# Patient Record
Sex: Female | Born: 1995 | Race: White
Health system: Southern US, Community
[De-identification: ages and names within clinical notes are randomized; demographics above are authoritative.]

## PROBLEM LIST (undated history)

## (undated) DIAGNOSIS — E0580 Other thyrotoxicosis without thyrotoxic crisis or storm: Secondary | ICD-10-CM

## (undated) DIAGNOSIS — M419 Scoliosis, unspecified: Secondary | ICD-10-CM

## (undated) DIAGNOSIS — E063 Autoimmune thyroiditis: Secondary | ICD-10-CM

## (undated) DIAGNOSIS — E049 Nontoxic goiter, unspecified: Secondary | ICD-10-CM

## (undated) DIAGNOSIS — N915 Oligomenorrhea, unspecified: Secondary | ICD-10-CM

## (undated) HISTORY — DX: Oligomenorrhea, unspecified: N91.5

## (undated) HISTORY — DX: Autoimmune thyroiditis: E06.3

## (undated) HISTORY — DX: Nontoxic goiter, unspecified: E04.9

## (undated) HISTORY — DX: Scoliosis, unspecified: M41.9

## (undated) HISTORY — DX: Autoimmune thyroiditis: E05.80

---
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
## 2014-02-26 ENCOUNTER — Telehealth: Payer: Self-pay | Admitting: Pediatrics

## 2014-02-26 NOTE — Telephone Encounter (Signed)
Mom and Kara SalterHaley are deciding if they are going to find an adult doctor or make an appointment with us. Mom will call us back when they decide.

## 2014-02-26 NOTE — Telephone Encounter (Signed)
Mom had questions about transitioning into family practice versus staying at pediatrics Assured her that we will see Kara Guerra while she is in school Concerns include post blood work that shows the potential of developing hypothyroidism and had estrogen levels checked approximately 1 year ago Kara Guerra has a history of anorexia, sees a nutritionist at school and a Warden/rangerpsychologist at home.  Kara Guerra has not had a period in 2 years and has gotten her weight to approximately 125lbs  Transferred call to schedulers.

## 2014-07-21 ENCOUNTER — Other Ambulatory Visit (HOSPITAL_COMMUNITY): Payer: Self-pay | Admitting: Nurse Practitioner

## 2014-07-21 DIAGNOSIS — N912 Amenorrhea, unspecified: Secondary | ICD-10-CM

## 2014-07-21 DIAGNOSIS — F5000 Anorexia nervosa, unspecified: Secondary | ICD-10-CM

## 2014-07-27 ENCOUNTER — Ambulatory Visit (HOSPITAL_COMMUNITY)
Admission: RE | Admit: 2014-07-27 | Discharge: 2014-07-27 | Disposition: A | Discharge status: Home / Self Care | Payer: 59 | Source: Ambulatory Visit | Attending: Pediatrics | Admitting: Pediatrics

## 2014-07-27 DIAGNOSIS — N912 Amenorrhea, unspecified: Secondary | ICD-10-CM

## 2014-07-27 DIAGNOSIS — F5000 Anorexia nervosa, unspecified: Secondary | ICD-10-CM | POA: Diagnosis not present

## 2014-07-27 DIAGNOSIS — Z13820 Encounter for screening for osteoporosis: Secondary | ICD-10-CM | POA: Diagnosis not present

## 2014-10-15 DIAGNOSIS — R630 Anorexia: Secondary | ICD-10-CM

## 2014-10-15 HISTORY — DX: Anorexia: R63.0

## 2020-11-18 NOTE — Progress Notes (Signed)
[facility] [place]                       Date of Exam: 11/23/2020 10:56 AM        Patient ID: Katelyn Owens is a 25 y.o. female.  Attending Physician: Freddie Apley, MD        Chief Complaint:    Chief Complaint   Patient presents with    Annual Exam     non fasting     Gynecologic Exam           Assessment / Plan:    1. Well female exam with routine gynecological exam    - CBC and differential  - Comprehensive metabolic panel  - TSH, Abn Reflex to Free T4, Serum  - Pap Smear, Thin Prep Method w/ HR HPV    2. Encounter for contraceptive management, unspecified type  Tolerating OCP. Will be running out soon (2 weeks)  Patient is due for lab/s - once back and result/s unremarkable, will refill patient's medication/s good for: 6 months.              Other Notes on Plan:    NONE              Follow-up:    No follow-ups on file.           HPI:    Visit Type: Preventative Visit    Reported Health: good health  Interval Events: none    Work Status: Nurse, learning disability residency  Diet: balanced diet   Exercise: weight lifting daily     Reproductive Health: sexually active and using contraception  STD concerns: denies knowledge of risky exposure  Patient is:   OB History   No obstetric history on file.       Health Screenings:  >Pap smear last done: 4 yrs ago, at 25 y/o. Normal per patient.             >Hepatitis C Screening: No components found for: HEPCAB     General Health Risks: no family history of colon cancer and no family history of breast cancer    There is no immunization history on file for this patient.    There are no preventive care reminders to display for this patient.    Other Concerns:     She's needing refill of her OCP  Tolerating this well. No history of migraines, liver issues. Non-smoker  Last time she's had labs done - she thinks may have been 3 yrs ago                Problem List:    There is no problem list on file for this patient.            Current Meds:     Outpatient Medications Marked as Taking for the 11/23/20 encounter (Office Visit) with Freddie Apley, MD   Medication Sig Dispense Refill    ibuprofen (ADVIL) 200 MG tablet ibuprofen 200 mg tablet   Take 1 tablet as needed by oral route.      Lysine 500 MG Cap Take by mouth      Multiple Vitamins-Minerals (Multivitamin Women) Tab Take by mouth      Nikki 3-0.02 MG per tablet Take 1 tablet by mouth daily            Allergies:    Not on File  Past Surgical History:    No past surgical history on file.        Family History:    Family History   Problem Relation Age of Onset    Thyroid disease Mother         hypothyroid    No known problems Father            Social History:    Social History     Tobacco Use    Smoking status: Never Smoker    Smokeless tobacco: Never Used   Haematologist Use: Never used   Substance Use Topics    Alcohol use: Yes     Comment: once a month    Drug use: Never           The following sections were reviewed this encounter by the provider:   Tobacco   Allergies   Meds   Problems   Med Hx   Surg Hx   Fam Hx              ROS:    Review of Systems   Constitutional: Negative for fever, negative for chills.   Respiratory: Negative for cough, negative for shortness of breath.  Gastrointestinal: Negative for abdominal distention.             Vital Signs:    BP 118/73 (BP Site: Left arm, Patient Position: Sitting, Cuff Size: Medium)    Pulse 60    Temp 97.9 F (36.6 C) (Temporal)    Resp 14    Ht 1.676 m (5\' 6" )    Wt 68 kg (150 lb)    LMP 10/31/2020 (Exact Date)    BMI 24.21 kg/m          Physical Exam:    Physical Exam   Constitutional: Appears well-developed and well-nourished.      HENT:   Head: Atraumatic.   Eyes: Conjunctivae and EOMs are normal.   Ears: Clear TM bilaterally  Mouth/Throat: tonsils not enlarged, no exudates     Neck: Neck supple.   Cardiovascular: Normal rate, regular rhythm, normal heart sounds, no murmur heard.  Pulmo/Chest: Effort normal, no  respiratory distress, breath sounds normal, no wheezes.   Abdominal: No distension, soft, no tenderness, no hepatosplenomegaly  Skin: Skin is warm.     Psychiatric: Has normal mood and affect. Behavior is normal. Thought content normal.   Neurologic/Musculoskeletal: Alert. Gait normal.                  No edema.     Breast (offered but patient declined having a chaperone for this exam): no skin changes, no discrete masses palpated, no axillary LN palpated, no nipple discharge    PELVIC EXAM  (offered but patient declined having a chaperone for this exam):   Vulva/Perineum: No lesions, normal hair distribution, normal anus   Urethral Meatus: Normal location and size, no lesions   Urethra: No masses, tenderness or scarring   Bladder: No masses, no tenderness   Vagina: Normal appearance, no lesions, no discharge   Cervix: No motion tenderness, no mass   Uterus: Normal size/contour/position, mobile, no tenderness   Adnexae: No masses, tenderness or nodularity              Freddie Apley, MD

## 2020-11-23 ENCOUNTER — Ambulatory Visit (INDEPENDENT_AMBULATORY_CARE_PROVIDER_SITE_OTHER): Payer: No Typology Code available for payment source | Admitting: Family Medicine

## 2020-11-23 ENCOUNTER — Encounter (INDEPENDENT_AMBULATORY_CARE_PROVIDER_SITE_OTHER): Payer: Self-pay | Admitting: Family Medicine

## 2020-11-23 VITALS — BP 118/73 | HR 60 | Temp 97.9°F | Resp 14 | Ht 66.0 in | Wt 150.0 lb

## 2020-11-23 DIAGNOSIS — E871 Hypo-osmolality and hyponatremia: Secondary | ICD-10-CM

## 2020-11-23 DIAGNOSIS — Z01419 Encounter for gynecological examination (general) (routine) without abnormal findings: Secondary | ICD-10-CM

## 2020-11-23 DIAGNOSIS — D696 Thrombocytopenia, unspecified: Secondary | ICD-10-CM

## 2020-11-23 DIAGNOSIS — Z309 Encounter for contraceptive management, unspecified: Secondary | ICD-10-CM

## 2020-11-23 LAB — HEMOLYSIS INDEX: Hemolysis Index: 3 (ref 0–24)

## 2020-11-23 LAB — CBC AND DIFFERENTIAL
Absolute NRBC: 0 10*3/uL (ref 0.00–0.00)
Basophils Absolute Automated: 0.06 10*3/uL (ref 0.00–0.08)
Basophils Automated: 1 %
Eosinophils Absolute Automated: 0.08 10*3/uL (ref 0.00–0.44)
Eosinophils Automated: 1.3 %
Hematocrit: 41 % (ref 34.7–43.7)
Hgb: 13.5 g/dL (ref 11.4–14.8)
Immature Granulocytes Absolute: 0.01 10*3/uL (ref 0.00–0.07)
Immature Granulocytes: 0.2 %
Lymphocytes Absolute Automated: 2.08 10*3/uL (ref 0.42–3.22)
Lymphocytes Automated: 34.6 %
MCH: 29.8 pg (ref 25.1–33.5)
MCHC: 32.9 g/dL (ref 31.5–35.8)
MCV: 90.5 fL (ref 78.0–96.0)
MPV: 12 fL (ref 8.9–12.5)
Monocytes Absolute Automated: 0.64 10*3/uL (ref 0.21–0.85)
Monocytes: 10.6 %
Neutrophils Absolute: 3.15 10*3/uL (ref 1.10–6.33)
Neutrophils: 52.3 %
Nucleated RBC: 0 /100 WBC (ref 0.0–0.0)
Platelets: 138 10*3/uL — ABNORMAL LOW (ref 142–346)
RBC: 4.53 10*6/uL (ref 3.90–5.10)
RDW: 13 % (ref 11–15)
WBC: 6.02 10*3/uL (ref 3.10–9.50)

## 2020-11-23 LAB — GFR: EGFR: >60

## 2020-11-23 LAB — COMPREHENSIVE METABOLIC PANEL
ALT: 14 U/L (ref 0–55)
AST (SGOT): 18 U/L (ref 5–34)
Albumin/Globulin Ratio: 1.5 (ref 0.9–2.2)
Albumin: 3.8 g/dL (ref 3.5–5.0)
Alkaline Phosphatase: 60 U/L (ref 37–117)
Anion Gap: 8 (ref 5.0–15.0)
BUN: 13 mg/dL (ref 7.0–19.0)
Bilirubin, Total: 0.4 mg/dL (ref 0.2–1.2)
CO2: 27 mEq/L (ref 21–29)
Calcium: 8.8 mg/dL (ref 8.5–10.5)
Chloride: 99 mEq/L — ABNORMAL LOW (ref 100–111)
Creatinine: 0.7 mg/dL (ref 0.4–1.5)
Globulin: 2.6 g/dL (ref 2.0–3.7)
Glucose: 81 mg/dL (ref 70–100)
Potassium: 3.6 mEq/L (ref 3.5–5.1)
Protein, Total: 6.4 g/dL (ref 6.0–8.3)
Sodium: 134 mEq/L — ABNORMAL LOW (ref 136–145)

## 2020-11-23 LAB — THYROID STIMULATING HORMONE (TSH), REFLEX ON ABNORMAL TO FREE T4, SERUM: TSH, Abn Reflex to Free T4, Serum: 2.68 u[IU]/mL (ref 0.35–4.94)

## 2020-11-23 NOTE — Progress Notes (Signed)
Have you seen any specialists/other providers since your last visit with [place]?    No      Arm preference verified?   Yes, no preference    Health Maintenance Due   Topic Date Due    COVID-19 Vaccine (2 - Pfizer 3-dose series) 08/13/2020

## 2020-11-24 MED ORDER — NIKKI 3-0.02 MG PO TABS
1.0000 | ORAL_TABLET | Freq: Every day | ORAL | 1 refills | Status: DC
Start: 2020-11-24 — End: 2021-05-07

## 2020-11-24 NOTE — Addendum Note (Signed)
Addended by: Lynelle Smoke MONICA R. on: 11/24/2020 09:03 AM     Modules accepted: Orders

## 2020-11-30 ENCOUNTER — Encounter (INDEPENDENT_AMBULATORY_CARE_PROVIDER_SITE_OTHER): Payer: Self-pay | Admitting: Family Medicine

## 2020-12-07 LAB — PAP SMEAR, THIN PREP WITH HR HPV: HPV DNA, high risk: NOT DETECTED

## 2020-12-20 ENCOUNTER — Encounter (INDEPENDENT_AMBULATORY_CARE_PROVIDER_SITE_OTHER): Payer: Self-pay | Admitting: Family Medicine

## 2021-05-07 ENCOUNTER — Other Ambulatory Visit (INDEPENDENT_AMBULATORY_CARE_PROVIDER_SITE_OTHER): Payer: Self-pay | Admitting: Family Medicine

## 2021-05-07 DIAGNOSIS — Z309 Encounter for contraceptive management, unspecified: Secondary | ICD-10-CM

## 2021-10-26 ENCOUNTER — Other Ambulatory Visit (INDEPENDENT_AMBULATORY_CARE_PROVIDER_SITE_OTHER): Payer: Self-pay | Admitting: Family Medicine

## 2021-10-26 DIAGNOSIS — Z309 Encounter for contraceptive management, unspecified: Secondary | ICD-10-CM

## 2021-10-27 MED ORDER — NIKKI 3-0.02 MG PO TABS
1.0000 | ORAL_TABLET | Freq: Every day | ORAL | 0 refills | Status: AC
Start: 2021-10-27 — End: ?

## 2021-10-27 NOTE — Telephone Encounter (Signed)
Noted, I sent it to:  [id number], [place] - [address]

## 2021-10-27 NOTE — Telephone Encounter (Signed)
Pls verify with pt which local pharmacy she would like to get script sent to. I cannot send to [place]) - if she's temporarily there, [facility] can [facility] [place], then she can call to transfer to [place].    She will also need routine physical scheduled in 11/2021, so I will sign script with no refills. Pls get her scheduled for PPE.    Thanks!

## 2021-10-27 NOTE — Telephone Encounter (Signed)
LVM. In the meantime I recommend to send to either of the local pharmacies and then she can call them to transfer to [place].
